# Patient Record
Sex: Female | Born: 1960 | Race: Black or African American | Hispanic: No | Marital: Married | Smoking: Former smoker
Health system: Southern US, Community
[De-identification: ages and names within clinical notes are randomized; demographics above are authoritative.]

## PROBLEM LIST (undated history)

## (undated) DIAGNOSIS — I10 Essential (primary) hypertension: Secondary | ICD-10-CM

## (undated) DIAGNOSIS — E119 Type 2 diabetes mellitus without complications: Secondary | ICD-10-CM

## (undated) DIAGNOSIS — E7800 Pure hypercholesterolemia, unspecified: Secondary | ICD-10-CM

## (undated) HISTORY — PX: CHOLECYSTECTOMY: SHX55

---

## 2008-01-07 ENCOUNTER — Emergency Department: Payer: Self-pay | Admitting: Emergency Medicine

## 2009-10-22 ENCOUNTER — Ambulatory Visit: Payer: Self-pay

## 2009-10-28 ENCOUNTER — Ambulatory Visit: Payer: Self-pay

## 2010-04-08 ENCOUNTER — Ambulatory Visit: Payer: Self-pay

## 2014-11-29 ENCOUNTER — Ambulatory Visit (INDEPENDENT_AMBULATORY_CARE_PROVIDER_SITE_OTHER): Payer: BC Managed Care – PPO

## 2014-11-29 ENCOUNTER — Encounter: Payer: Self-pay | Admitting: Podiatry

## 2014-11-29 ENCOUNTER — Ambulatory Visit (INDEPENDENT_AMBULATORY_CARE_PROVIDER_SITE_OTHER): Payer: BC Managed Care – PPO | Admitting: Podiatry

## 2014-11-29 VITALS — BP 125/90 | HR 72 | Resp 16 | Ht 62.0 in | Wt 192.0 lb

## 2014-11-29 DIAGNOSIS — M2040 Other hammer toe(s) (acquired), unspecified foot: Secondary | ICD-10-CM

## 2014-11-29 DIAGNOSIS — L84 Corns and callosities: Secondary | ICD-10-CM

## 2014-11-29 DIAGNOSIS — M779 Enthesopathy, unspecified: Secondary | ICD-10-CM

## 2014-11-29 MED ORDER — TRIAMCINOLONE ACETONIDE 10 MG/ML IJ SUSP
10.0000 mg | Freq: Once | INTRAMUSCULAR | Status: AC
  Administered 2014-11-29: 10 mg

## 2014-11-29 NOTE — Progress Notes (Signed)
   Subjective:    Patient ID: Crystal Aguilar, female    DOB: 11/15/1961, 53 y.o.   MRN: [id number]  HPI Comments: i have corns and calluses , and i have a piece of glass on the bottom of my foot since i was 53 years old, try to dig it out and i cant get it. The  Bottom of my feet are painful   Foot Pain      Review of Systems  All other systems reviewed and are negative.      Objective:   Physical Exam        Assessment & Plan:

## 2014-11-30 NOTE — Progress Notes (Signed)
Subjective:     Patient ID: Crystal Aguilar, female   DOB: 04/09/1961, 53 y.o.   MRN: [id number]  HPI patient states I have severe discomfort in my little toes of both feet underneath the fifth metatarsal right over left and also in the right arch were I stepped on something when I was a child. 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      Objective:   Physical Exam  Constitutional: She is oriented to person, place, and time.  Cardiovascular: Intact distal pulses.   Musculoskeletal: Normal range of motion.  Neurological: She is oriented to person, place, and time.  Skin: Skin is warm and dry.  Nursing note and vitals reviewed.  neurovascular status intact with muscle strength adequate and range of motion subtalar midtarsal joint within normal limits. Patient is noted to have severe keratotic lesions fifth digits of both feet severe keratotic lesion sub-fifth metatarsal right over left with fluid buildup around the fifth MPJ right and keratotic lesion within the left plantar arch that's painful when palpated     Assessment:     Significant skin structural issues with hammertoe deformity plantarflexed metatarsal capsulitis right and significant lesion formation    Plan:     H&P x-rays reviewed and conditions explained to patient. Today I injected the right fifth MPJ capsule 3 mg Kenalog 5 g Xylocaine and did deep debridement of all lesions. Patient tolerated procedures well will be seen back in 8 weeks and understands that ultimately she may require surgery which even with this may not solve the severe skin manifestation she has

## 2015-01-31 ENCOUNTER — Ambulatory Visit: Payer: BC Managed Care – PPO | Admitting: Podiatry

## 2015-02-14 ENCOUNTER — Ambulatory Visit: Payer: Self-pay | Admitting: Podiatry

## 2016-12-07 ENCOUNTER — Ambulatory Visit (INDEPENDENT_AMBULATORY_CARE_PROVIDER_SITE_OTHER): Payer: Self-pay | Admitting: Podiatry

## 2016-12-07 ENCOUNTER — Ambulatory Visit (INDEPENDENT_AMBULATORY_CARE_PROVIDER_SITE_OTHER): Payer: Self-pay

## 2016-12-07 VITALS — BP 178/97 | HR 85 | Temp 97.2°F | Resp 16

## 2016-12-07 DIAGNOSIS — L84 Corns and callosities: Secondary | ICD-10-CM

## 2016-12-07 DIAGNOSIS — M79672 Pain in left foot: Secondary | ICD-10-CM

## 2016-12-07 DIAGNOSIS — R52 Pain, unspecified: Secondary | ICD-10-CM

## 2016-12-07 DIAGNOSIS — L851 Acquired keratosis [keratoderma] palmaris et plantaris: Secondary | ICD-10-CM

## 2016-12-07 DIAGNOSIS — M79671 Pain in right foot: Secondary | ICD-10-CM

## 2016-12-18 NOTE — Progress Notes (Signed)
Subjective: Patient presents to the office today for chief complaint of painful callus lesions of the feet. Patient states that the pain is ongoing and is affecting their ability to ambulate without pain. Patient presents today for further treatment and evaluation. Patient also states that when she was 55 years old she stepped on a piece of glass to her left plantar foot. Patient believes the piece of glass is still there. Patient has experienced the left foot callus on the plantar heel for greater than 1 year.  Objective:  Physical Exam General: Alert and oriented x3 in no acute distress  Dermatology: Hyperkeratotic lesion present on the weightbearing surface of the bilateral feet and a plantar aspect of the left heel. Pain on palpation with a central nucleated core noted.  Skin is warm, dry and supple bilateral lower extremities. Negative for open lesions or macerations.  Vascular: Palpable pedal pulses bilaterally. No edema or erythema noted. Capillary refill within normal limits.  Neurological: Epicritic and protective threshold grossly intact bilaterally.   Musculoskeletal Exam: Pain on palpation at the keratotic lesion noted. Range of motion within normal limits bilateral. Muscle strength 5/5 in all groups bilateral.  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  #3 Treated area(s) with Salinocaine and dressed with light dressing. #4 local anesthesia block was performed to the left plantar heel using 3 mL of lidocaine with epinephrine. The plantar heel callus was debrided down to viable subcutaneous tissue however no foreign body or glass was identified.  #5 dry sterile dressing was applied. #6 Patient is to return to the clinic in 2 weeks   Felecia ShellingBrent M.  Evans, DPM [facility]

## 2017-03-08 ENCOUNTER — Ambulatory Visit: Payer: Self-pay | Admitting: Podiatry

## 2017-10-17 ENCOUNTER — Emergency Department
Admission: EM | Admit: 2017-10-17 | Discharge: 2017-10-17 | Disposition: A | Discharge status: Home / Self Care | Payer: BLUE CROSS/BLUE SHIELD | Attending: Emergency Medicine | Admitting: Emergency Medicine

## 2017-10-17 ENCOUNTER — Encounter: Payer: Self-pay | Admitting: Emergency Medicine

## 2017-10-17 DIAGNOSIS — K0889 Other specified disorders of teeth and supporting structures: Secondary | ICD-10-CM

## 2017-10-17 DIAGNOSIS — G5601 Carpal tunnel syndrome, right upper limb: Secondary | ICD-10-CM | POA: Diagnosis not present

## 2017-10-17 DIAGNOSIS — F17210 Nicotine dependence, cigarettes, uncomplicated: Secondary | ICD-10-CM | POA: Diagnosis not present

## 2017-10-17 MED ORDER — NAPROXEN 375 MG PO TABS: 375.0000 mg | ORAL_TABLET | Freq: Two times a day (BID) | ORAL | 0 refills | Status: AC

## 2017-10-17 MED ORDER — CLINDAMYCIN HCL 300 MG PO CAPS: 300.0000 mg | ORAL_CAPSULE | Freq: Three times a day (TID) | ORAL | 0 refills | Status: AC

## 2017-10-17 NOTE — ED Provider Notes (Signed)
[facility] Emergency Department Provider Note  ____________________________________________   I have reviewed the triage vital signs and the nursing notes.   HISTORY  Chief Complaint Dental Pain and Headache    HPI Crystal Aguilar is a 56 y.o. female 2 different complaints the first is she has chronic dental pain for year and a half.  She does not want to go see a dentist because she does not like dentist no fever no change in that, no headache, just dental pain.  The second is that for the last several months she has been having pain in her right hand which is a cramping discomfort when she sleeps sometimes and also when she moves it the wrong way.  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  She has cramps in that hand specifically no chest pain or shortness of breath no referred pain down her arm.  She denies any weakness.  She states that she was having some cramping in the hand earlier today which her primary care doctor and was sent to the emergency room.  Location hand, right Radiation none Quality chronic cramping occasional tingling Duration months Timing at rest when she is lying on it sometimes as well as at work sometimes Severity mild to moderate at this time not bad Associated sxs none PriorTreatment none   History reviewed. No pertinent past medical history.  There are no active problems to display for this patient.   History reviewed. No pertinent surgical history.  Prior to Admission medications   Not on File    Allergies Penicillins  No family history on file.  Social History Social History  Substance Use Topics  . Smoking status: Current Every Day Smoker    Types: Cigarettes  . Smokeless tobacco: Never Used  . Alcohol use 0.0 oz/week    Review of Systems Constitutional: No fever/chills Eyes: No visual changes. ENT: No sore throat. No stiff neck no neck pain Cardiovascular: Denies  chest pain. Respiratory: Denies shortness of breath. Gastrointestinal:   no vomiting.  No diarrhea.  No constipation. Genitourinary: Negative for dysuria. Musculoskeletal: Negative lower extremity swelling Skin: Negative for rash. Neurological: Negative for severe headaches, focal weakness or numbness.   ____________________________________________   PHYSICAL EXAM:  VITAL SIGNS: ED Triage Vitals  Enc Vitals Group     BP 10/17/17 1728 (!) 146/87     Pulse Rate 10/17/17 1728 79     Resp 10/17/17 1728 16     Temp 10/17/17 1728 97.8 F (36.6 C)     Temp Source 10/17/17 1728 Oral     SpO2 --      Weight 10/17/17 1729 212 lb (96.2 kg)     Height 10/17/17 1729 5\' 2"  (1.575 m)     Head Circumference --      Peak Flow --      Pain Score 10/17/17 1728 4     Pain Loc --      Pain Edu? --      Excl. in GC? --     Constitutional: Alert and oriented. Well appearing and in no acute distress. Eyes: Conjunctivae are normal Head: Atraumatic HEENT: No congestion/rhinnorhea. Mucous membranes are moist.  Oropharynx non-erythematous, very poor dentition, there is minimal pain to palpation in poor dentition on the upper tooth on the right which is a sole remaining molar, as well as the remaining molars on the right which are not significantly decayed no abscess noted no swelling  or inflammation Neck:   Nontender with no meningismus, no masses, no stridor Cardiovascular: Normal rate, regular rhythm. Grossly normal heart sounds.  Good peripheral circulation. Respiratory: Normal respiratory effort.  No retractions. Lungs CTAB. Abdominal: Soft and nontender. No distention. No guarding no rebound Back:  There is no focal tenderness or step off.  there is no midline tenderness there are no lesions noted. there is no CVA tenderness  Musculoskeletal: No lower extremity tenderness, no upper extremity tenderness. No joint effusions, no DVT signs strong distal pulses no edema Neurologic:  Normal speech  and language. No gross focal neurologic deficits are appreciated.  Positive Phalen's test, so palpation of the flexor retinaculum brings up the patient's symptoms.  No thenar atrophy noted.  No hypothenar atrophy noted.  Strength and sensation intact in all fields of the hand. Skin:  Skin is warm, dry and intact. No rash noted. Psychiatric: Mood and affect are normal. Speech and behavior are normal.  ____________________________________________   LABS (all labs ordered are listed, but only abnormal results are displayed)  Labs Reviewed - No data to display  Pertinent labs  results that were available during my care of the patient were reviewed by me and considered in my medical decision making (see chart for details). ____________________________________________  EKG  I personally interpreted any EKGs ordered by me or triage  ____________________________________________  RADIOLOGY  Pertinent labs & imaging results that were available during my care of the patient were reviewed by me and considered in my medical decision making (see chart for details). If possible, patient and/or family made aware of any abnormal findings. ____________________________________________    PROCEDURES  Procedure(s) performed: None  Procedures  Critical Care performed: None  ____________________________________________   INITIAL IMPRESSION / ASSESSMENT AND PLAN / ED COURSE  Pertinent labs & imaging results that were available during my care of the patient were reviewed by me and considered in my medical decision making (see chart for details).  Patient here with chief complaints of first is a chronic dental pain, patient does not have any evidence of an abscess she has been having pain every single day for the last year and a half" is stubborn" does not want to go see a dentist.  I do not see any evidence of abscess but as the pain is slightly worse over the last couple days, we will give her  penicillin and close outpatient follow-up.  Return precautions are given and understood.  Nothing to suggest Ludwig's angina or other acute pathology of the mouth.  Patient with cramping and tingling in the right hand isolated to the right hand for 6 months at least possibly as long as a year or more.  Exam is very reassuring she has an NIH stroke scale of 0, no evidence of weakness, not likely to be radiculopathy, involves the entire hand is very positional, patient does repetitive motions, is elicitable with palpation of the flexor retinaculum, most likely this is carpal tunnel we will place her in a splint, advised NSAIDs and have her closely follow up with primary care.    ____________________________________________   FINAL CLINICAL IMPRESSION(S) / ED DIAGNOSES  Final diagnoses:  None      This chart was dictated using voice recognition software.  Despite best efforts to proofread,  errors can occur which can change meaning.      Jeanmarie Plant, MD 10/17/17 [phone]

## 2017-10-17 NOTE — Discharge Instructions (Signed)
No increased pain or swelling in your mouth, numbness or weakness anywhere besides your hand,  worsening numbness or weakness in the hand, increased headache, increased dental pain, swelling or pain you feel worse in any way return to the emergency room.    OPTIONS FOR DENTAL FOLLOW UP CARE  [place] [facility] Health and [facility] Services - Local Safety Net Dental Clinics [email or web address]   [facility] [phone])  [facility] [phone])  [place] [phone])  [facility] [phone])  [facility] [phone]) This clinic caters to the indigent population and is on a lottery system. Location: [employer] of Dentistry, [employer], [address] [place] Clinic Hours: Wednesdays from 6pm - 9pm, patients seen by a lottery system. For dates, call or go to [email or web address] Services: Cleanings, fillings and simple extractions. Payment Options: DENTAL WORK IS FREE OF CHARGE. Bring proof of income or support. Best way to get seen: Arrive at 5:15 pm - this is a lottery, NOT first come/first serve, so arriving earlier will not increase your chances of being seen.     [facility] [phone] Select option 1 for emergencies   Location: [facility] of Dentistry, [place], [address] [place] Clinic Hours: No walk-ins accepted - call the day before to schedule an appointment. Check in times are 9:30 am and 1:30 pm. Services: Simple extractions, temporary fillings, pulpectomy/pulp debridement, uncomplicated abscess drainage. Payment Options: PAYMENT IS DUE AT THE TIME OF SERVICE.  Fee is usually $100-200, additional surgical procedures (e.g. abscess drainage) may be extra. Cash, checks, Visa/MasterCard accepted.  Can file Medicaid if patient is covered for dental - patient should call  case worker to check. No discount for [facility] patients. Best way to get seen: MUST call the day before and get onto the schedule. Can usually be seen the next 1-2 days. No walk-ins accepted.     [facility] Dental Services [phone]   Location: [facility], [address], [place] Clinic Hours: M, W, Th, F 8am or 1:30pm, Tues 9a or 1:30 - first come/first served. Services: Simple extractions, temporary fillings, uncomplicated abscess drainage.  You do not need to be an [facility] resident. Payment Options: PAYMENT IS DUE AT THE TIME OF SERVICE. Dental insurance, otherwise sliding scale - bring proof of income or support. Depending on income and treatment needed, cost is usually $50-200. Best way to get seen: Arrive early as it is first come/first served.     [facility] [phone]   Location: [address] Clinic Hours: Mon-Thu 8a-5p Services: Most basic dental services including extractions and fillings. Payment Options: PAYMENT IS DUE AT THE TIME OF SERVICE. Sliding scale, up to 50% off - bring proof if income or support. Medicaid with dental option accepted. Best way to get seen: Call to schedule an appointment, can usually be seen within 2 weeks OR they will try to see walk-ins - show up at 8a or 2p (you may have to wait).     [facility] [phone] [place] RESIDENTS ONLY   Location: [facility], [address] [place], [place] [address] Clinic Hours: By appointment only. Monday - Thursday 8am-5pm, Friday 8am-12pm Services: Cleanings, fillings, extractions. Payment Options: PAYMENT IS DUE AT THE TIME OF SERVICE. Cash, Visa or MasterCard. Sliding scale - $30 minimum per service. Best way to get seen: Come in to office, complete packet and  make an appointment - need proof of income or support monies for each household member and proof of  [place] residence. Usually takes about a month to get in.     [facility] [phone]   Location: [address] [place] Clinic Hours: Walk-in Urgent Care Dental Services are offered Monday-Friday mornings only. The numbers of emergencies accepted daily is limited to the number of providers available. Maximum 15 - Mondays, Wednesdays & Thursdays Maximum 10 - Tuesdays & Fridays Services: You do not need to be a [place] resident to be seen for a dental emergency. Emergencies are defined as pain, swelling, abnormal bleeding, or dental trauma. Walkins will receive x-rays if needed. NOTE: Dental cleaning is not an emergency. Payment Options: PAYMENT IS DUE AT THE TIME OF SERVICE. Minimum co-pay is $40.00 for uninsured patients. Minimum co-pay is $3.00 for Medicaid with dental coverage. Dental Insurance is accepted and must be presented at time of visit. Medicare does not cover dental. Forms of payment: Cash, credit card, checks. Best way to get seen: If not previously registered with the clinic, walk-in dental registration begins at 7:15 am and is on a first come/first serve basis. If previously registered with the clinic, call to make an appointment.     [facility] [phone] [place] RESIDENTS ONLY   Location: [address], [place], [place] Clinic Hours: Mon-Thu 10a-2p Services: Extractions only! Payment Options: FREE (donations accepted) - bring proof of income or support Best way to get seen: Call and schedule an appointment OR come at 8am on the 1st Monday of every month (except for holidays) when it is first come/first served.     [facility] [phone]   Location: [address], [place] Clinic Hours: Friday mornings Services, Payment Options, Best way to get seen: Call for info

## 2017-10-17 NOTE — ED Notes (Signed)
Upon assessment pt alert and oriented x 4 and in no apparent distress. Pt does report tingling and burning in fingers and toes. Pt has equal palpable pulses in all 4 extremities.

## 2017-10-17 NOTE — ED Notes (Signed)

## 2017-10-17 NOTE — ED Triage Notes (Signed)
C/O right hand burning earlier and now right hand tingling also c/o right sided headache and jaw pain.  Jaw pain intermittently "for a while, but that is dental pain"  Hand tingling started today at around 1200 and headache started around the same time.

## 2019-08-28 ENCOUNTER — Other Ambulatory Visit: Payer: Self-pay

## 2019-08-28 ENCOUNTER — Emergency Department
Admission: EM | Admit: 2019-08-28 | Discharge: 2019-08-29 | Disposition: A | Discharge status: Home / Self Care | Payer: BC Managed Care – PPO | Attending: Emergency Medicine | Admitting: Emergency Medicine

## 2019-08-28 ENCOUNTER — Emergency Department: Payer: BC Managed Care – PPO

## 2019-08-28 DIAGNOSIS — Y939 Activity, unspecified: Secondary | ICD-10-CM | POA: Insufficient documentation

## 2019-08-28 DIAGNOSIS — F17210 Nicotine dependence, cigarettes, uncomplicated: Secondary | ICD-10-CM | POA: Diagnosis not present

## 2019-08-28 DIAGNOSIS — W19XXXA Unspecified fall, initial encounter: Secondary | ICD-10-CM | POA: Diagnosis not present

## 2019-08-28 DIAGNOSIS — S43014A Anterior dislocation of right humerus, initial encounter: Secondary | ICD-10-CM | POA: Insufficient documentation

## 2019-08-28 DIAGNOSIS — Y92009 Unspecified place in unspecified non-institutional (private) residence as the place of occurrence of the external cause: Secondary | ICD-10-CM | POA: Diagnosis not present

## 2019-08-28 DIAGNOSIS — Y999 Unspecified external cause status: Secondary | ICD-10-CM | POA: Diagnosis not present

## 2019-08-28 DIAGNOSIS — S4991XA Unspecified injury of right shoulder and upper arm, initial encounter: Secondary | ICD-10-CM | POA: Diagnosis present

## 2019-08-28 MED ORDER — MORPHINE SULFATE (PF) 4 MG/ML IV SOLN
4.0000 mg | Freq: Once | INTRAVENOUS | Status: AC
  Administered 2019-08-28: 4 mg via INTRAVENOUS
  Filled 2019-08-28: qty 1

## 2019-08-28 MED ORDER — HYDROMORPHONE HCL 1 MG/ML IJ SOLN
1.0000 mg | Freq: Once | INTRAMUSCULAR | Status: AC
  Administered 2019-08-28: 1 mg via INTRAVENOUS

## 2019-08-28 MED ORDER — HYDROMORPHONE HCL 1 MG/ML IJ SOLN
INTRAMUSCULAR | Status: AC
  Filled 2019-08-28: qty 1

## 2019-08-28 NOTE — ED Provider Notes (Signed)
[facility] Emergency Department Provider Note  ____________________________________________   First MD Initiated Contact with Patient 08/28/19 2258     (approximate)  I have reviewed the triage vital signs and the nursing notes.   HISTORY  Chief Complaint Fall   HPI Crystal Aguilar is a 58 y.o. female presents to the emergency department via EMS following accidental trip and fall at home.  Patient admits to 10 out of 10 right shoulder pain.  EMS noted gross deformity.  Patient given 50 mcg of fentanyl in route however pain score still 10 out of 10.      History reviewed. No pertinent past medical history.  There are no active problems to display for this patient.   History reviewed. No pertinent surgical history.  Prior to Admission medications   Medication Sig Start Date End Date Taking? Authorizing Provider  oxyCODONE-acetaminophen (PERCOCET) 5-325 MG tablet Take 1 tablet by mouth every 4 (four) hours as needed. 08/29/19 08/28/20  Darci CurrentBrown, Dawson N, MD    Allergies Penicillins  History reviewed. No pertinent family history.  Social History Social History   Tobacco Use  . Smoking status: Current Every Day Smoker    Types: Cigarettes  . Smokeless tobacco: Never Used  Substance Use Topics  . Alcohol use: Yes    Alcohol/week: 0.0 standard drinks  . Drug use: No    Review of Systems Constitutional: No fever/chills Eyes: No visual changes. ENT: No sore throat. Cardiovascular: Denies chest pain. Respiratory: Denies shortness of breath. Gastrointestinal: No abdominal pain.  No nausea, no vomiting.  No diarrhea.  No constipation. Genitourinary: Negative for dysuria. Musculoskeletal: Negative for neck pain.  Negative for back pain. Integumentary: Negative for rash. Neurological: Negative for headaches, focal weakness or numbness.   ____________________________________________   PHYSICAL EXAM:  VITAL SIGNS: ED Triage Vitals  Enc  Vitals Group     BP 08/28/19 2256 (!) 158/90     Pulse Rate 08/28/19 2256 64     Resp 08/28/19 2256 18     Temp 08/28/19 2256 98 F (36.7 C)     Temp Source 08/28/19 2256 Oral     SpO2 08/28/19 2256 97 %     Weight 08/28/19 2254 102.1 kg (225 lb)     Height 08/28/19 2254 1.575 m (5\' 2" )     Head Circumference --      Peak Flow --      Pain Score 08/28/19 2253 10     Pain Loc --      Pain Edu? --      Excl. in GC? --     Constitutional: Alert and oriented. Apparent discomfort Eyes: Conjunctivae are normal.  Head: Atraumatic. Mouth/Throat: Mucous membranes are moist. Neck: No stridor.  No meningeal signs.   Cardiovascular: Normal rate, regular rhythm. Good peripheral circulation. Grossly normal heart sounds. Respiratory: Normal respiratory effort.  No retractions. Gastrointestinal: Soft and nontender. No distention.  Musculoskeletal: Gross deformity of the right shoulder pain with minimal active  range of motion. Neurologic:  Normal speech and language. No gross focal neurologic deficits are appreciated.  Skin:  Skin is warm, dry and intact. Psychiatric: Mood and affect are normal. Speech and behavior are normal.  ____________________________________________   LABS (all labs ordered are listed, but only abnormal results are displayed)  Labs Reviewed - No data to display _____________________________________  RADIOLOGY I, [place] Dewayne ShorterN Carolyne Whitsel, personally viewed and evaluated these images (plain radiographs) as part of my medical decision making, as well as reviewing  the written report by the radiologist.  ED MD interpretation: Initial x-ray revealed anterior right shoulder dislocation.  Repeat x-ray revealed successful reduction of right shoulder dislocation.  Official radiology report(s): Dg Shoulder Right  Result Date: 08/29/2019 CLINICAL DATA:  Postreduction EXAM: RIGHT SHOULDER - 2+ VIEW COMPARISON:  08/28/2019 FINDINGS: Interval reduction of the previously seen dislocated  right shoulder. No fracture. AC joint is intact. IMPRESSION: Interval reduction.  No visible fracture. Electronically Signed   By: Charlett Nose M.D.   On: 08/29/2019 00:25   Dg Shoulder Right  Result Date: 08/28/2019 CLINICAL DATA:  58 year old female the with fall and trauma to the right shoulder. EXAM: RIGHT SHOULDER - 2+ VIEW COMPARISON:  None. FINDINGS: There is anterior dislocation of the right shoulder. No definite acute fracture. The soft tissues are unremarkable. IMPRESSION: Anterior dislocation of the right shoulder. Electronically Signed   By: Elgie Collard M.D.   On: 08/28/2019 23:21    ____________________________________________   PROCEDURES   .Ortho Injury Treatment  Date/Time: 08/29/2019 2:18 AM Performed by: Darci Current, MD Authorized by: Darci Current, MD   Consent:    Consent obtained:  Verbal   Consent given by:  Patient   Risks discussed:  Fracture, irreducible dislocation and nerve damage   Alternatives discussed:  Alternative treatmentInjury location: shoulder Location details: right shoulder Injury type: dislocation Dislocation type: anterior Hill-Sachs deformity: no Pre-procedure neurovascular assessment: neurovascularly intact Pre-procedure distal perfusion: normal Pre-procedure neurological function: normal Pre-procedure range of motion: normal  Anesthesia: Local anesthesia used: no  Patient sedated: NoManipulation performed: yes Reduction method: scapular manipulation Immobilization: sling Post-procedure neurovascular assessment: post-procedure neurovascularly intact Post-procedure distal perfusion: normal Post-procedure neurological function: normal Post-procedure range of motion: improved Patient tolerance: patient tolerated the procedure well with no immediate complications      ____________________________________________   INITIAL IMPRESSION / MDM / ASSESSMENT AND PLAN / ED COURSE  As part of my medical decision making, I  reviewed the following data within the electronic MEDICAL RECORD NUMBER   58 year old female presented with above-stated history and physical exam secondary to traumatic right shoulder anterior dislocation which was reduced in the emergency department.  Patient's shoulder actually repeatedly dislocated while in the ED however was successfully reduced each time.  Patient is currently in a sling.  Patient received IV morphine and subsequently Dilaudid in the emergency department for pain control.  Patient discussed with Dr. Ernest Pine who she will follow-up with on the outpatient setting.      ____________________________________________  FINAL CLINICAL IMPRESSION(S) / ED DIAGNOSES  Final diagnoses:  Anterior dislocation of right shoulder, initial encounter     MEDICATIONS GIVEN DURING THIS VISIT:  Medications  HYDROmorphone (DILAUDID) 1 MG/ML injection (has no administration in time range)  morphine 4 MG/ML injection 4 mg (4 mg Intravenous Given 08/28/19 2350)  HYDROmorphone (DILAUDID) injection 1 mg (1 mg Intravenous Given 08/28/19 2330)  oxyCODONE-acetaminophen (PERCOCET/ROXICET) 5-325 MG per tablet 1 tablet (1 tablet Oral Given 08/29/19 0134)     ED Discharge Orders         Ordered    oxyCODONE-acetaminophen (PERCOCET) 5-325 MG tablet  Every 4 hours PRN     08/29/19 0205          *Please note:  Crystal Aguilar was evaluated in Emergency Department on 08/29/2019 for the symptoms described in the history of present illness. She was evaluated in the context of the global COVID-19 pandemic, which necessitated consideration that the patient might be at risk for infection with the  SARS-CoV-2 virus that causes COVID-19. Institutional protocols and algorithms that pertain to the evaluation of patients at risk for COVID-19 are in a state of rapid change based on information released by regulatory bodies including the CDC and federal and state organizations. These policies and algorithms were followed  during the patient's care in the ED.  Some ED evaluations and interventions may be delayed as a result of limited staffing during the pandemic.*  Note:  This document was prepared using Dragon voice recognition software and may include unintentional dictation errors.   Gregor Hams, MD 08/29/19 Rogene Houston

## 2019-08-28 NOTE — ED Triage Notes (Signed)
Pt arrives from home via [facility] after falling on right knee then rolling onto right shoulder this evening. Pt given 38mcg fentanyl in route. PT in NAD, A&Ox4. Dr. Owens Shark at bedside

## 2019-08-28 NOTE — ED Notes (Signed)
Shoulder joint is back in place. Pt continues to lay on her stomach with left arm dangling. Weights removed. Husband at bedside.

## 2019-08-29 ENCOUNTER — Emergency Department: Payer: BC Managed Care – PPO

## 2019-08-29 MED ORDER — OXYCODONE-ACETAMINOPHEN 5-325 MG PO TABS: 1.0000 | ORAL_TABLET | ORAL | 0 refills | Status: DC | PRN

## 2019-08-29 MED ORDER — OXYCODONE-ACETAMINOPHEN 5-325 MG PO TABS
1.0000 | ORAL_TABLET | Freq: Once | ORAL | Status: AC
  Administered 2019-08-29: 1 via ORAL
  Filled 2019-08-29: qty 1

## 2019-08-29 NOTE — ED Notes (Signed)
This Rn in with DR Owens Shark to assist with manipulation of the right shoulder. Several attempts unsuccessful. Pt placed on abdomen with 10 pound weights applied and right arm to hang. Husband at bedside. Ortho paged.

## 2019-08-29 NOTE — ED Notes (Addendum)
HOB adjusted for pt. Pt states pain dec some once HOB adjusted.

## 2019-08-29 NOTE — ED Notes (Signed)
Pt resting in bed with right arm in sling. Pt is comfortable at this time,

## 2019-08-29 NOTE — ED Notes (Signed)
X-ray at bedside

## 2019-09-05 ENCOUNTER — Other Ambulatory Visit (HOSPITAL_COMMUNITY): Payer: Self-pay | Admitting: Student

## 2019-09-05 ENCOUNTER — Other Ambulatory Visit: Payer: Self-pay | Admitting: Student

## 2019-09-05 DIAGNOSIS — S46011A Strain of muscle(s) and tendon(s) of the rotator cuff of right shoulder, initial encounter: Secondary | ICD-10-CM

## 2019-09-05 DIAGNOSIS — S43014A Anterior dislocation of right humerus, initial encounter: Secondary | ICD-10-CM

## 2019-09-16 ENCOUNTER — Other Ambulatory Visit: Payer: Self-pay

## 2019-09-16 ENCOUNTER — Ambulatory Visit
Admission: RE | Admit: 2019-09-16 | Discharge: 2019-09-16 | Disposition: A | Discharge status: Home / Self Care | Payer: BC Managed Care – PPO | Source: Ambulatory Visit | Attending: Student | Admitting: Student

## 2019-09-16 DIAGNOSIS — S46011A Strain of muscle(s) and tendon(s) of the rotator cuff of right shoulder, initial encounter: Secondary | ICD-10-CM | POA: Insufficient documentation

## 2019-09-16 DIAGNOSIS — S43014A Anterior dislocation of right humerus, initial encounter: Secondary | ICD-10-CM | POA: Insufficient documentation

## 2020-04-13 ENCOUNTER — Other Ambulatory Visit: Payer: Self-pay

## 2020-04-13 ENCOUNTER — Emergency Department
Admission: EM | Admit: 2020-04-13 | Discharge: 2020-04-13 | Disposition: A | Discharge status: Home / Self Care | Payer: BC Managed Care – PPO | Attending: Emergency Medicine | Admitting: Emergency Medicine

## 2020-04-13 ENCOUNTER — Encounter: Payer: Self-pay | Admitting: Emergency Medicine

## 2020-04-13 ENCOUNTER — Emergency Department: Payer: BC Managed Care – PPO

## 2020-04-13 DIAGNOSIS — K5730 Diverticulosis of large intestine without perforation or abscess without bleeding: Secondary | ICD-10-CM | POA: Diagnosis not present

## 2020-04-13 DIAGNOSIS — R1030 Lower abdominal pain, unspecified: Secondary | ICD-10-CM | POA: Diagnosis present

## 2020-04-13 DIAGNOSIS — K5790 Diverticulosis of intestine, part unspecified, without perforation or abscess without bleeding: Secondary | ICD-10-CM

## 2020-04-13 DIAGNOSIS — F17210 Nicotine dependence, cigarettes, uncomplicated: Secondary | ICD-10-CM | POA: Diagnosis not present

## 2020-04-13 LAB — COMPREHENSIVE METABOLIC PANEL
ALT: 17 U/L (ref 0–44)
AST: 17 U/L (ref 15–41)
Albumin: 4.1 g/dL (ref 3.5–5.0)
Alkaline Phosphatase: 54 U/L (ref 38–126)
Anion gap: 8 (ref 5–15)
BUN: 21 mg/dL — ABNORMAL HIGH (ref 6–20)
CO2: 25 mmol/L (ref 22–32)
Calcium: 9.8 mg/dL (ref 8.9–10.3)
Chloride: 103 mmol/L (ref 98–111)
Creatinine, Ser: 0.82 mg/dL (ref 0.44–1.00)
GFR calc Af Amer: >60 mL/min (ref >60)
GFR calc non Af Amer: >60 mL/min (ref >60)
Glucose, Bld: 84 mg/dL (ref 70–99)
Potassium: 4.2 mmol/L (ref 3.5–5.1)
Sodium: 136 mmol/L (ref 135–145)
Total Bilirubin: 0.6 mg/dL (ref 0.3–1.2)
Total Protein: 7.6 g/dL (ref 6.5–8.1)

## 2020-04-13 LAB — URINALYSIS, COMPLETE (UACMP) WITH MICROSCOPIC
Bacteria, UA: NONE SEEN
Bilirubin Urine: NEGATIVE
Glucose, UA: NEGATIVE mg/dL
Ketones, ur: NEGATIVE mg/dL
Leukocytes,Ua: NEGATIVE
Nitrite: NEGATIVE
Protein, ur: NEGATIVE mg/dL
Specific Gravity, Urine: 1.02 (ref 1.005–1.030)
pH: 6 (ref 5.0–8.0)

## 2020-04-13 LAB — CBC
HCT: 44.6 % (ref 36.0–46.0)
Hemoglobin: 14.4 g/dL (ref 12.0–15.0)
MCH: 27.5 pg (ref 26.0–34.0)
MCHC: 32.3 g/dL (ref 30.0–36.0)
MCV: 85.1 fL (ref 80.0–100.0)
Platelets: 280 10*3/uL (ref 150–400)
RBC: 5.24 MIL/uL — ABNORMAL HIGH (ref 3.87–5.11)
RDW: 13.9 % (ref 11.5–15.5)
WBC: 7 10*3/uL (ref 4.0–10.5)
nRBC: 0 % (ref 0.0–0.2)

## 2020-04-13 LAB — LIPASE, BLOOD: Lipase: 45 U/L (ref 11–51)

## 2020-04-13 NOTE — ED Provider Notes (Signed)
[facility] Emergency Department Provider Note  ____________________________________________   First MD Initiated Contact with Patient 04/13/20 1640     (approximate)  I have reviewed the triage vital signs and the nursing notes.   HISTORY  Chief Complaint Abdominal Pain    HPI Crystal Aguilar is a 59 y.o. female presents emergency department with complaints of lower abdominal pain that goes from the left to the right side.  Patient still has an appendix.  Patient states it feels like it is above her bladder and had some difficulty urinating.  She denies any fever or chills.  No chest pain or shortness of breath.  No vomiting or diarrhea    History reviewed. No pertinent past medical history.  There are no problems to display for this patient.   History reviewed. No pertinent surgical history.  Prior to Admission medications   Not on File    Allergies Penicillins  History reviewed. No pertinent family history.  Social History Social History   Tobacco Use  . Smoking status: Current Every Day Smoker    Types: Cigarettes  . Smokeless tobacco: Never Used  Substance Use Topics  . Alcohol use: Yes    Alcohol/week: 0.0 standard drinks  . Drug use: No    Review of Systems  Constitutional: No fever/chills Eyes: No visual changes. ENT: No sore throat. Respiratory: Denies cough Cardiovascular: Denies chest pain Gastrointestinal: Positive abdominal pain Genitourinary: Negative for dysuria. Musculoskeletal: Negative for back pain. Skin: Negative for rash. Psychiatric: no mood changes,     ____________________________________________   PHYSICAL EXAM:  VITAL SIGNS: ED Triage Vitals  Enc Vitals Group     BP 04/13/20 1344 (!) 146/87     Pulse Rate 04/13/20 1344 74     Resp 04/13/20 1344 18     Temp 04/13/20 1344 98.3 F (36.8 C)     Temp Source 04/13/20 1344 Oral     SpO2 04/13/20 1344 100 %     Weight 04/13/20 1345 215 lb  (97.5 kg)     Height 04/13/20 1345 5\' 2"  (1.575 m)     Head Circumference --      Peak Flow --      Pain Score 04/13/20 1345 10     Pain Loc --      Pain Edu? --      Excl. in GC? --     Constitutional: Alert and oriented. Well appearing and in no acute distress. Eyes: Conjunctivae are normal.  Head: Atraumatic. Nose: No congestion/rhinnorhea. Mouth/Throat: Mucous membranes are moist.   Neck:  supple no lymphadenopathy noted Cardiovascular: Normal rate, regular rhythm. Heart sounds are normal Respiratory: Normal respiratory effort.  No retractions, lungs c t a  Abd: soft tender in the lower abdomen, increased across suprapubic area towards the right, bs normal all 4 quad GU: deferred Musculoskeletal: FROM all extremities, warm and well perfused Neurologic:  Normal speech and language.  Skin:  Skin is warm, dry and intact. No rash noted. Psychiatric: Mood and affect are normal. Speech and behavior are normal.  ____________________________________________   LABS (all labs ordered are listed, but only abnormal results are displayed)  Labs Reviewed  COMPREHENSIVE METABOLIC PANEL - Abnormal; Notable for the following components:      Result Value   BUN 21 (*)    All other components within normal limits  CBC - Abnormal; Notable for the following components:   RBC 5.24 (*)    All other components within normal limits  URINALYSIS, COMPLETE (UACMP) WITH MICROSCOPIC - Abnormal; Notable for the following components:   Hgb urine dipstick MODERATE (*)    All other components within normal limits  LIPASE, BLOOD   ____________________________________________   ____________________________________________  RADIOLOGY  CT renal stone  ____________________________________________   PROCEDURES  Procedure(s) performed: No  Procedures    ____________________________________________   INITIAL IMPRESSION / ASSESSMENT AND PLAN / ED COURSE  Pertinent labs & imaging results  that were available during my care of the patient were reviewed by me and considered in my medical decision making (see chart for details).   Patient is a 59 year old female presents emergency department with complaints of abdominal pain.  See HPI  Physical exam shows patient to appear well.  Abdomen is tender in the left and right lower quadrant but more so suprapubically  DDx: Kidney stone, appendicitis, UTI  CBC is normal, comprehensive metabolic panel is normal, urinalysis has moderate amount of hemoglobin no bacteria  CT renal stone study shows diverticulosis but no other abnormality  I did explain the findings to the patient.  I feel that this is a chronic intermittent abdominal pain.  With her concerns of urination a explained her she may want to consider following up with a GYN doctor to see if her bladder has dropped.  She is to drink plenty of fluids.  Return emergency department worsening.  Is given a work note and discharged stable condition.    Crystal Aguilar was evaluated in Emergency Department on 04/13/2020 for the symptoms described in the history of present illness. She was evaluated in the context of the global COVID-19 pandemic, which necessitated consideration that the patient might be at risk for infection with the SARS-CoV-2 virus that causes COVID-19. Institutional protocols and algorithms that pertain to the evaluation of patients at risk for COVID-19 are in a state of rapid change based on information released by regulatory bodies including the CDC and federal and state organizations. These policies and algorithms were followed during the patient's care in the ED.   As part of my medical decision making, I reviewed the following data within the [place] notes reviewed and incorporated, Labs reviewed , Old chart reviewed, Radiograph reviewed , Notes from prior ED visits and [place] Controlled Substance  Database  ____________________________________________   FINAL CLINICAL IMPRESSION(S) / ED DIAGNOSES  Final diagnoses:  Diverticulosis  Lower abdominal pain      NEW MEDICATIONS STARTED DURING THIS VISIT:  Current Discharge Medication List       Note:  This document was prepared using Dragon voice recognition software and may include unintentional dictation errors.    Versie Starks, PA-C 04/13/20 1817    Earleen Newport, MD 04/13/20 Einar Crow

## 2020-04-13 NOTE — ED Triage Notes (Signed)
FIRST NURSE NOTE- here for lower abdominal pain from [facility].  NAD at this time.

## 2020-04-13 NOTE — Discharge Instructions (Signed)
Follow-up with your regular doctor if not improving in 3 to 4 days.  Return emergency department if worsening.  You may want to visit a GYN to see if your bladder has dropped as you continue to have difficulty with urination.  Return as needed.

## 2020-04-13 NOTE — ED Triage Notes (Signed)
Pt here for lower abdominal pain that goes across whole lower area. Denies NVD.  Describes pain as severe and constant.  No hx of same.  No fever.  VSS at this time.

## 2020-09-08 IMAGING — CT CT RENAL STONE PROTOCOL
2 of 4 series · 16 of 46 positions shown, 18 images · non-contrast
Comparison: None.

CLINICAL DATA: Lower abdominal pain

EXAM:
CT ABDOMEN AND PELVIS WITHOUT CONTRAST
TECHNIQUE: Multidetector CT imaging of the abdomen and pelvis was performed
following the standard protocol without IV contrast.

[Series 2: stone full standard · axial · 0.84mm/px · z∈[-480,-95]mm · 13 of 85 slices shown, 15 images]
[im 4/85  soft-tissue]
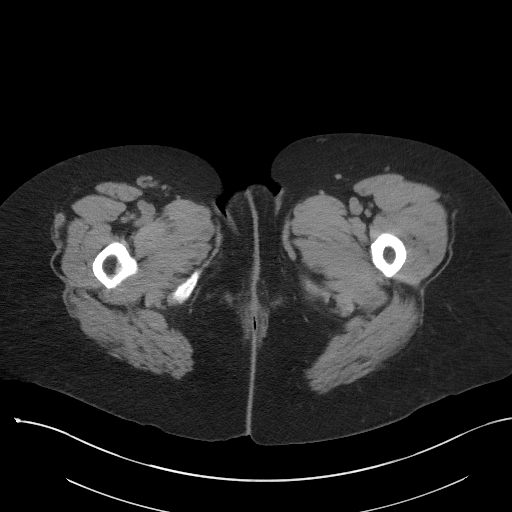
[im 4/85  bone]
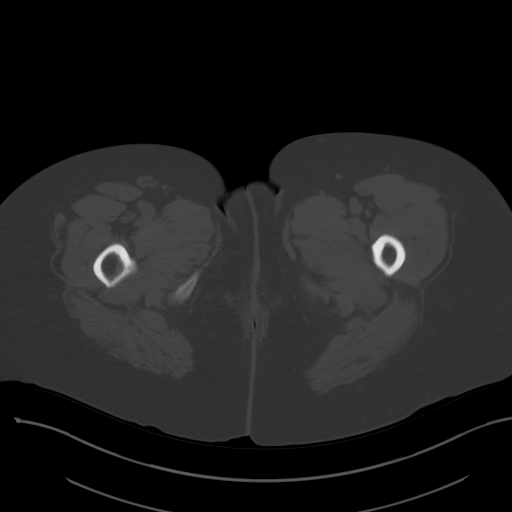
[im 11/85  soft-tissue]
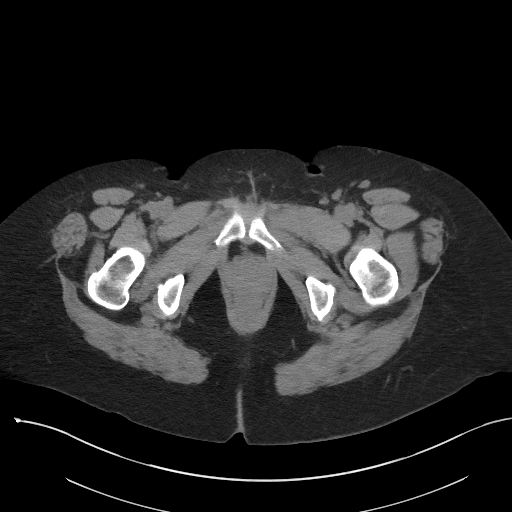
[im 17/85  soft-tissue]
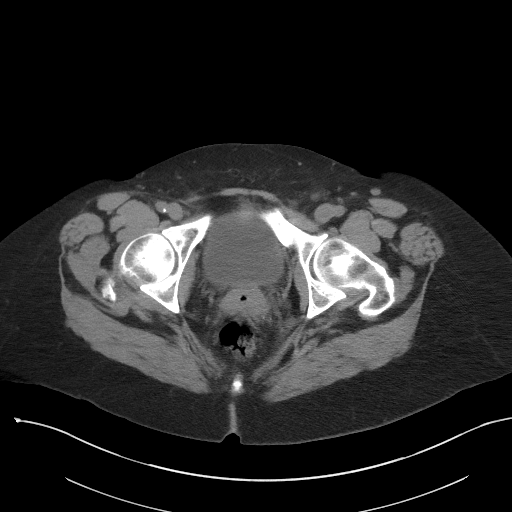
[im 24/85  soft-tissue]
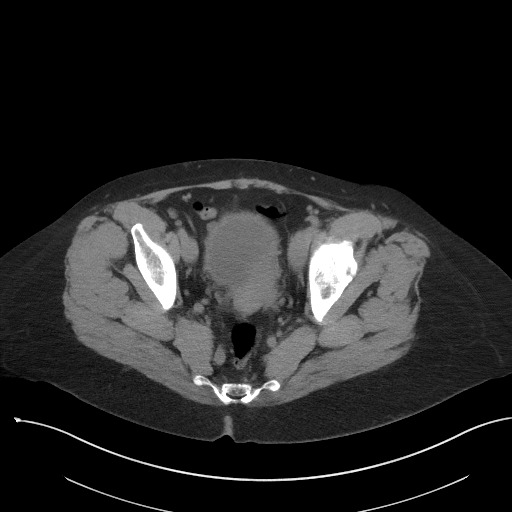
[im 31/85  soft-tissue]
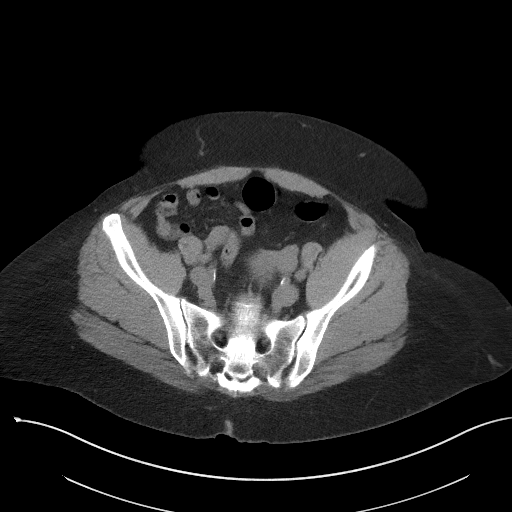
[im 37/85  soft-tissue]
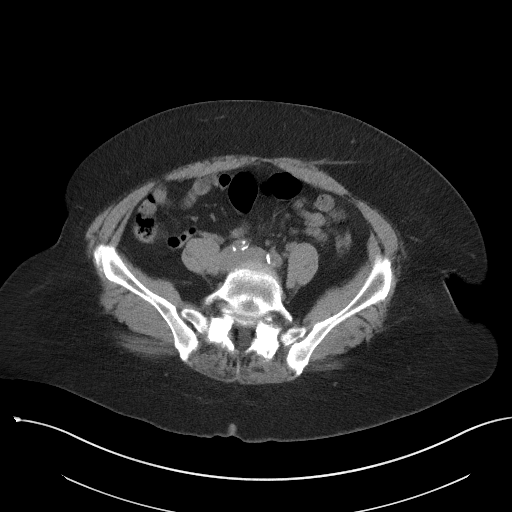
[im 44/85  soft-tissue]
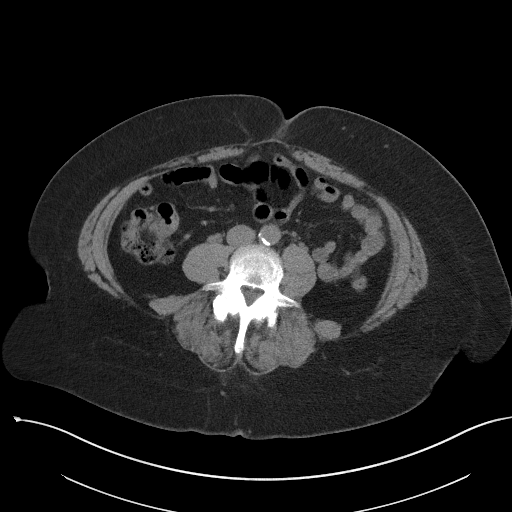
[im 48/85  soft-tissue]
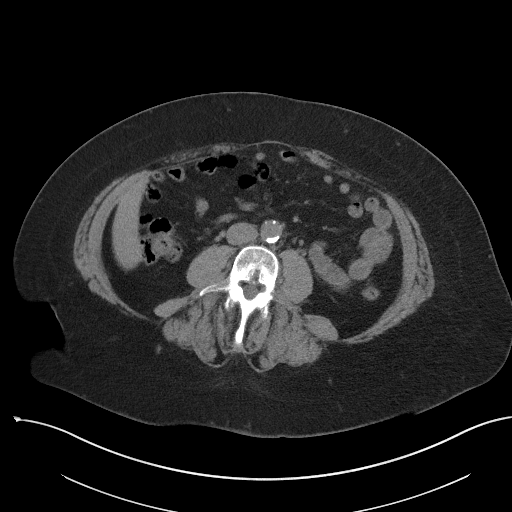
[im 54/85  soft-tissue]
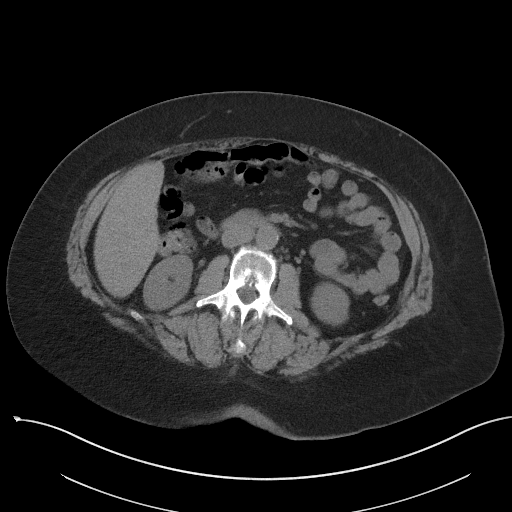
[im 54/85  bone]
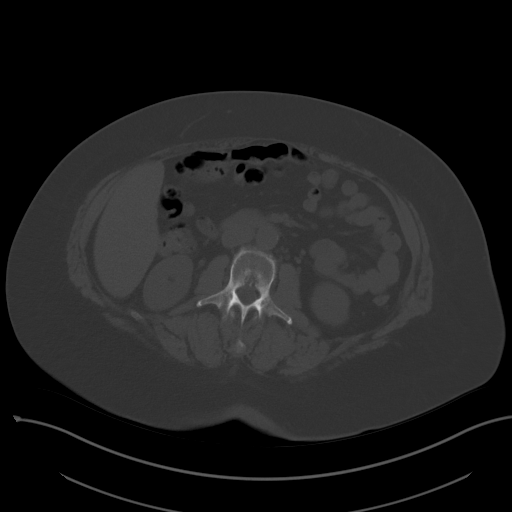
[im 61/85  soft-tissue]
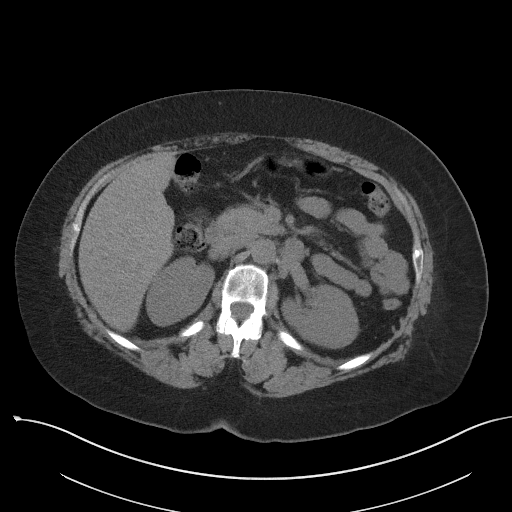
[im 68/85  soft-tissue]
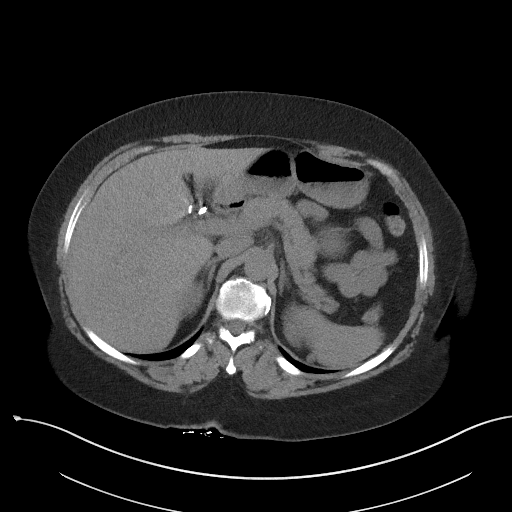
[im 74/85  soft-tissue]
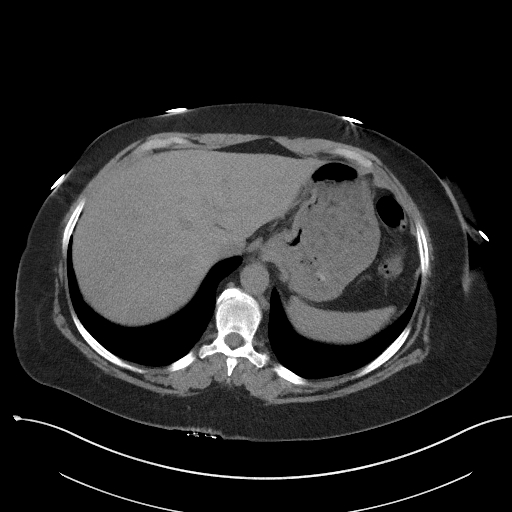
[im 81/85  soft-tissue]
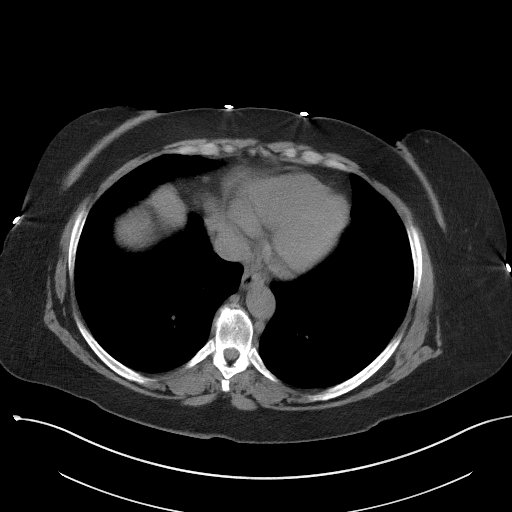

[Series 5: coronal · coronal · 0.72mm/px · 3 of 149 slices shown]
[im 50/149  soft-tissue]
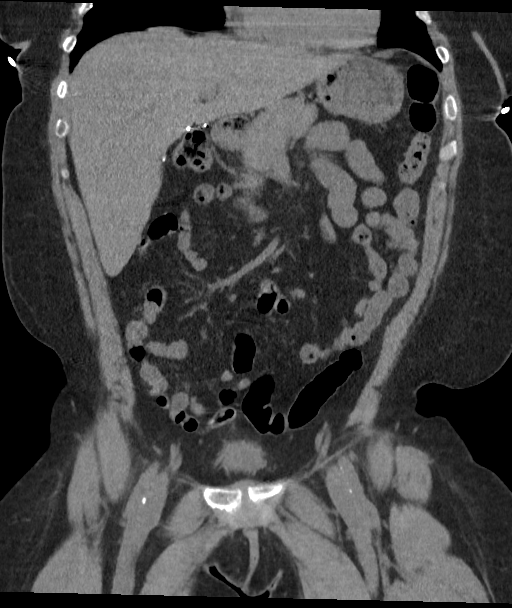
[im 66/149  soft-tissue]
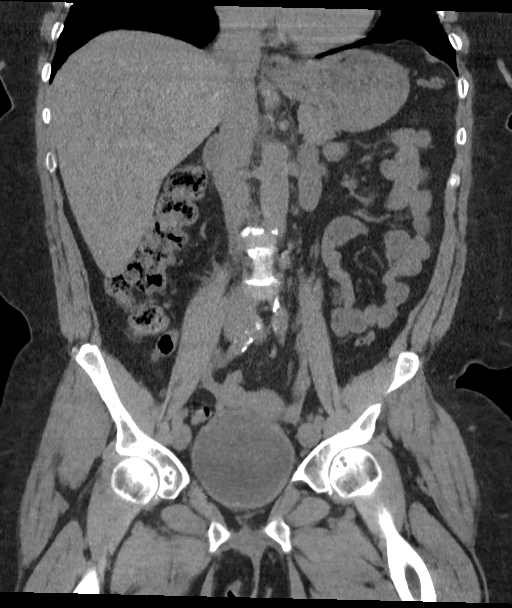
[im 83/149  soft-tissue]
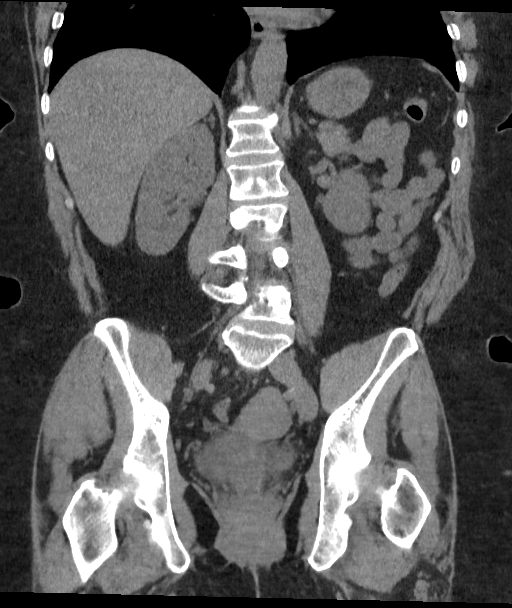

[16 of 46 positions shown; findings below may reference images not displayed]

FINDINGS: Lower chest: No acute pleural or parenchymal lung disease.

Hepatobiliary: No focal liver abnormality is seen. Status post
cholecystectomy. No biliary dilatation.

Pancreas: Unremarkable. No pancreatic ductal dilatation or
surrounding inflammatory changes.

Spleen: Normal in size without focal abnormality.

Adrenals/Urinary Tract: No urinary tract calculi or obstructive
uropathy. The adrenals are unremarkable. Bladder is moderately
distended with no focal abnormalities.

Stomach/Bowel: No bowel obstruction or ileus. Scattered
diverticulosis of the descending colon without diverticulitis. No
bowel wall thickening or inflammatory change.

Vascular/Lymphatic: Aortic atherosclerosis. No enlarged abdominal or
pelvic lymph nodes.

Reproductive: Uterus and bilateral adnexa are unremarkable.

Other: No abdominal wall hernia or abnormality. No abdominopelvic
ascites.

Musculoskeletal: No acute or destructive bony lesions. Reconstructed
images demonstrate no additional findings.
IMPRESSION: 1. No urinary tract calculi or obstructive uropathy.
2. Scattered diverticulosis of the descending colon without
diverticulitis.
3. Aortic Atherosclerosis ([id number]).

## 2021-09-22 LAB — COLOGUARD: COLOGUARD: NEGATIVE

## 2022-01-07 ENCOUNTER — Other Ambulatory Visit: Payer: Self-pay

## 2022-01-07 DIAGNOSIS — U071 COVID-19: Secondary | ICD-10-CM | POA: Diagnosis not present

## 2022-01-07 DIAGNOSIS — R040 Epistaxis: Secondary | ICD-10-CM | POA: Insufficient documentation

## 2022-01-07 DIAGNOSIS — R0981 Nasal congestion: Secondary | ICD-10-CM | POA: Diagnosis present

## 2022-01-07 MED ORDER — OXYMETAZOLINE HCL 0.05 % NA SOLN
1.0000 | Freq: Once | NASAL | Status: AC
  Administered 2022-01-07: 1 via NASAL
  Filled 2022-01-07: qty 30

## 2022-01-07 NOTE — ED Triage Notes (Signed)
Pt presents to ER c/o nosebleed that started today around 1700 and pt was able to get it to stop.  Pt states nosebleed started again tonight around 2230 and pt has not been able to stop this nosebleed which is why she came in. Pt states she has been sick with cold-like symptoms for last few days.   Pt denies blowing nose a lot with her cold.  Pt denies taking blood thinners.  Pt A&O x4 at this time in NAD.

## 2022-01-08 ENCOUNTER — Emergency Department
Admission: EM | Admit: 2022-01-08 | Discharge: 2022-01-08 | Disposition: A | Discharge status: Home / Self Care | Payer: BC Managed Care – PPO | Attending: Emergency Medicine | Admitting: Emergency Medicine

## 2022-01-08 DIAGNOSIS — R040 Epistaxis: Secondary | ICD-10-CM

## 2022-01-08 LAB — RESP PANEL BY RT-PCR (FLU A&B, COVID) ARPGX2
Influenza A by PCR: NEGATIVE
Influenza B by PCR: NEGATIVE
SARS Coronavirus 2 by RT PCR: POSITIVE — AB

## 2022-01-08 NOTE — ED Notes (Signed)
See triage note, pt reports intermittent nose bleed yesterday. No bleeding noted. No blood thinner use. EDP at bedside.

## 2022-01-08 NOTE — Progress Notes (Signed)
°   01/08/22 0525  Clinical Encounter Type  Visited With Patient  Visit Type Initial;Spiritual support;Social support   Chaplain Burris assisted Pt with her inquiry about wait times. Spoke with her and offered support and encouragement.

## 2022-01-08 NOTE — Discharge Instructions (Addendum)
Please take Tylenol and ibuprofen/Advil for your pain.  It is safe to take them together, or to alternate them every few hours.  Take up to 1000mg of Tylenol at a time, up to 4 times per day.  Do not take more than 4000 mg of Tylenol in 24 hours.  For ibuprofen, take 400-600 mg, 4-5 times per day. ° ° °

## 2022-01-08 NOTE — ED Provider Notes (Signed)
[facility] Provider Note    Event Date/Time   First MD Initiated Contact with Patient 01/08/22 0701     (approximate)   History   Epistaxis   HPI  Crystal Aguilar is a 61 y.o. female who presents to the ED for evaluation of Epistaxis   I review outpatient PCP visit from May 2022.  Tobacco abuse, morbid obesity and no blood thinners.  Patient presents to the ED, accompanied by her husband, for evaluation of left-sided epistaxis.  Patient reports 1-2 weeks of respiratory congestion and frequent blowing of her nose.  Denies fever, cough or systemic symptoms.  Reports developing left-sided epistaxis, bleeding anteriorly, yesterday evening around 5 PM, lasting less than 1 hour before self resolving.  Recurred again around 10 PM, and due to the recurrent she presented to the ED for evaluation.  She reports it resolved in triage with application of Afrin and a nasal clamp.  She has not been waiting nearly 8 hours for my evaluation, due to remarkable staffing issues and bed holds.  Present time, no recurrence of bleeding and she has no complaints during my evaluation.   Physical Exam   Triage Vital Signs: ED Triage Vitals  Enc Vitals Group     BP 01/07/22 2336 (!) 152/98     Pulse Rate 01/07/22 2336 (!) 104     Resp 01/07/22 2336 20     Temp 01/07/22 2336 98.7 F (37.1 C)     Temp Source 01/07/22 2336 Oral     SpO2 01/07/22 2336 96 %     Weight 01/07/22 2349 225 lb (102.1 kg)     Height 01/07/22 2349 5\' 2"  (1.575 m)     Head Circumference --      Peak Flow --      Pain Score 01/07/22 2349 8     Pain Loc --      Pain Edu? --      Excl. in GC? --     Most recent vital signs: Vitals:   01/07/22 2336 01/08/22 0719  BP: (!) 152/98 128/80  Pulse: (!) 104 85  Resp: 20 20  Temp: 98.7 F (37.1 C)   SpO2: 96% 98%    General: Awake, no distress.  CV:  Good peripheral perfusion.  Resp:  Normal effort.  Abd:  No distention.  MSK:  No deformity  noted.  Neuro:  No focal deficits appreciated. Other:  Nonbleeding bilateral nares, no evidence of blood posteriorly on oropharyngeal examination.  No obvious anterior lesions on the left to require cauterization   ED Results / Procedures / Treatments   Labs (all labs ordered are listed, but only abnormal results are displayed) Labs Reviewed  RESP PANEL BY RT-PCR (FLU A&B, COVID) [id number]    EKG    RADIOLOGY   Official radiology report(s): No results found.  PROCEDURES and INTERVENTIONS:  Procedures  Medications  oxymetazoline (AFRIN) 0.05 % nasal spray 1 spray (1 spray Each Nare Given 01/07/22 2353)     IMPRESSION / MDM / ASSESSMENT AND PLAN / ED COURSE  I reviewed the triage vital signs and the nursing notes.  61 year old female presents to the ED with resolved left-sided anterior epistaxis suitable for outpatient management.  She is tachycardic in triage while bleeding and uncomfortable, and this vital sign derangement resolves after hemostasis.  She was observed for nearly 8 hours without recurrence of her bleeding.  No indications for diagnostic blood work, as I have a very low suspicion  for significant blood loss anemia.  Provided the remainder of her Afrin bottle and a clean nose clamp to go.  We discussed epistaxis management at home and return precautions for the ED.  We will swab her for COVID-19 prior to discharge considering her subacute symptoms and concern for flu or COVID.      FINAL CLINICAL IMPRESSION(S) / ED DIAGNOSES   Final diagnoses:  Anterior epistaxis  Left-sided epistaxis     Rx / DC Orders   ED Discharge Orders     None        Note:  This document was prepared using Dragon voice recognition software and may include unintentional dictation errors.   Delton [place], MD 01/08/22 [phone]

## 2022-01-11 ENCOUNTER — Ambulatory Visit
Admission: EM | Admit: 2022-01-11 | Discharge: 2022-01-11 | Disposition: A | Discharge status: Other Acute Inpatient Hospital | Payer: BC Managed Care – PPO

## 2022-01-11 ENCOUNTER — Other Ambulatory Visit: Payer: Self-pay

## 2022-01-11 DIAGNOSIS — I10 Essential (primary) hypertension: Secondary | ICD-10-CM

## 2022-01-11 DIAGNOSIS — R519 Headache, unspecified: Secondary | ICD-10-CM | POA: Diagnosis not present

## 2022-01-11 DIAGNOSIS — R29810 Facial weakness: Secondary | ICD-10-CM

## 2022-01-11 NOTE — ED Notes (Signed)
Patient is being discharged from the Urgent Care and sent to the Emergency Department via POV . Per Margarette Canada NP, patient is in need of higher level of care due to possible stroke. Patient is aware and verbalizes understanding of plan of care.  Vitals:   01/11/22 1309  BP: (!) 147/93  Pulse: 68  Resp: 18  Temp: 98.8 F (37.1 C)  SpO2: 98%

## 2022-01-11 NOTE — ED Triage Notes (Signed)
Pt here with C/O head spinning, for over 1 week, was at ER on 01/07/2022, states she didn't feel like they addressed it.

## 2022-01-11 NOTE — Discharge Instructions (Addendum)
As we discussed, your physical exam has some abnormal findings that are concerning for you having possibly had a stroke.  Therefore, I recommend that she go to the emergency department at [facility] for further evaluation, specialty consultation, and dynamic imaging of your brain.

## 2022-01-11 NOTE — ED Provider Notes (Signed)
[facility]    CSN: [id number] Arrival date & time: 01/11/22  1120      History   Chief Complaint Chief Complaint  Patient presents with   Dizziness    HPI Crystal Aguilar is a 61 y.o. female.   HPI  61 year old female here for evaluation of neurologic complaints.  Patient reports that for over a week she has been experiencing what she calls "head spinning".  She states that this is not room spinning but is within her head and upon clarification it sounds like she is experiencing headaches.  She is also been having intermittent hot and cold chills and decreased appetite.  She has taken a single 500 mg Tylenol tablet at 2 different occasions in the past week for her headache without any improvement.  She denies any changes in vision, weakness, chest pain, shortness of breath.  She states that she is having pain in her left arm and it is hard to raise over her head.  She reports that she was evaluated in the emergency department at [facility] on 01/07/2022 but she does not feel like her symptoms were properly addressed.  History reviewed. No pertinent past medical history.  There are no problems to display for this patient.   History reviewed. No pertinent surgical history.  OB History   No obstetric history on file.      Home Medications    Prior to Admission medications   Not on File    Family History History reviewed. No pertinent family history.  Social History Social History   Tobacco Use   Smoking status: Former    Types: Cigarettes   Smokeless tobacco: Never  Vaping Use   Vaping Use: Never used  Substance Use Topics   Alcohol use: Yes    Alcohol/week: 0.0 standard drinks   Drug use: No     Allergies   Penicillins   Review of Systems Review of Systems  Constitutional:  Positive for chills. Negative for activity change and appetite change.  Respiratory:  Negative for shortness of breath.   Cardiovascular:  Negative for chest pain.   Musculoskeletal:  Positive for myalgias.  Neurological:  Positive for headaches. Negative for dizziness, syncope, speech difficulty and weakness.  Hematological: Negative.   Psychiatric/Behavioral: Negative.      Physical Exam Triage Vital Signs ED Triage Vitals  Enc Vitals Group     BP 01/11/22 1309 (!) 147/93     Pulse Rate 01/11/22 1309 68     Resp 01/11/22 1309 18     Temp 01/11/22 1309 98.8 F (37.1 C)     Temp Source 01/11/22 1309 Oral     SpO2 01/11/22 1309 98 %     Weight 01/11/22 1308 225 lb (102.1 kg)     Height 01/11/22 1308 5\' 2"  (1.575 m)     Head Circumference --      Peak Flow --      Pain Score 01/11/22 1308 0     Pain Loc --      Pain Edu? --      Excl. in GC? --    No data found.  Updated Vital Signs BP (!) 147/93 (BP Location: Left Arm)    Pulse 68    Temp 98.8 F (37.1 C) (Oral)    Resp 18    Ht 5\' 2"  (1.575 m)    Wt 225 lb (102.1 kg)    SpO2 98%    BMI 41.15 kg/m   Visual Acuity  Right Eye Distance:   Left Eye Distance:   Bilateral Distance:    Right Eye Near:   Left Eye Near:    Bilateral Near:     Physical Exam Vitals and nursing note reviewed.  Constitutional:      General: She is in acute distress.     Appearance: She is obese.  HENT:     Head: Normocephalic and atraumatic.     Right Ear: Tympanic membrane, ear canal and external ear normal. There is no impacted cerumen.     Left Ear: Tympanic membrane and ear canal normal. There is no impacted cerumen.     Nose: Nose normal.     Mouth/Throat:     Mouth: Mucous membranes are moist.     Pharynx: Oropharynx is clear. No posterior oropharyngeal erythema.     Comments: Mucous membranes are sticky. Eyes:     Extraocular Movements: Extraocular movements intact.     Pupils: Pupils are equal, round, and reactive to light.     Comments: Sclera are tacky in appearance.    Skin:    General: Skin is warm and dry.     Capillary Refill: Capillary refill takes less than 2 seconds.   Neurological:     Mental Status: She is alert.     Sensory: No sensory deficit.     Motor: Weakness present.     Coordination: Coordination abnormal.     UC Treatments / Results  Labs (all labs ordered are listed, but only abnormal results are displayed) Labs Reviewed - No data to display  EKG   Radiology No results found.  Procedures Procedures (including critical care time)  Medications Ordered in UC Medications - No data to display  Initial Impression / Assessment and Plan / UC Course  I have reviewed the triage vital signs and the nursing notes.  Pertinent labs & imaging results that were available during my care of the patient were reviewed by me and considered in my medical decision making (see chart for details).  Patient is a 61 year old female who appears to be in a moderate degree of distress secondary to headache pain presenting for evaluation of greater than 1 week worth of headache.  Patient describes her headache as "head spinning".  Patient reports that she was evaluated in the emergency department at [facility] on 01/07/2022 for a nosebleed and she was experiencing her headache at that time.  She states that she does not feel like the ER addressed her headache at that time.  Upon reading the ER note there is no mention of headache or dizziness.  The patient presented with left-sided epistaxis and was treated with Afrin and nose clamp in triage.  Her BP in triage was 152/98 and she was tachycardiac at 104.  Upon reviewing the historical trend of blood pressures in epic patient has a history of elevated blood pressure on her last 3 visits to the urgent care.  Patient does not actively carry a diagnosis of hypertension and states that her BP has always been borderline.  Review of her last clinic note at [facility] [address] failure medicine showed a BP of 132/80.  On physical exam patient's pupils are equal round and reactive but she does have difficulty following my finger to assess extraocular  motion.  She was found able to do so on her fifth attempt with great difficulty.  I asked the patient to remove her mask so that I could assess the remainder of her cranial nerves and noticed that she  has a slight facial droop on the left.  As I have never seen this patient before I asked her significant other to look at her face and he to agreed that it was drooping on the left-hand side.  Patient is able to raise her eyebrows and puff out her cheeks though there is a deficit on the left-hand side as well.  The patient is able to stick out her tongue but she cannot move it from side to side and it tends to fasciculate.  Patient's left grip is a 4/5 in her right grip is a 5/5.  Left upper extremity strength is also 4/5 in her right upper extremity strength is 5/5.  When attempting to check pronator drift patient has a slight pronator drift on the left and she is unable to maintain her palm upwards to the air as it rotates inward as her arm dropped slightly.  Patient's bilateral lower extremity strength is 5/5.  My concern is that patient has had a subacute neurologic event and I feel she would best be served with a further evaluation in the emergency department to include dynamic imaging of her head with CT and/or MRI, neither of which are available to me here in the urgent care.  Patient is ambulatory and she left in stable condition [facility].   Final Clinical Impressions(s) / UC Diagnoses   Final diagnoses:  Facial droop  Acute intractable headache, unspecified headache type  Hypertension, unspecified type     Discharge Instructions      As we discussed, your physical exam has some abnormal findings that are concerning for you having possibly had a stroke.  Therefore, I recommend that she go to the emergency [facility] for further evaluation, specialty consultation, and dynamic imaging of your brain.     ED Prescriptions   None    PDMP not  reviewed this encounter.   Becky Augustayan, Lacorey Brusca, NP 01/11/22 1512

## 2024-11-07 LAB — COLOGUARD: COLOGUARD: NEGATIVE

## 2024-12-24 ENCOUNTER — Encounter: Payer: Self-pay | Admitting: Emergency Medicine

## 2024-12-24 ENCOUNTER — Other Ambulatory Visit: Payer: Self-pay

## 2024-12-24 ENCOUNTER — Emergency Department
Admission: EM | Admit: 2024-12-24 | Discharge: 2024-12-24 | Disposition: A | Discharge status: Home / Self Care | Attending: Emergency Medicine | Admitting: Emergency Medicine

## 2024-12-24 DIAGNOSIS — R1012 Left upper quadrant pain: Secondary | ICD-10-CM | POA: Diagnosis present

## 2024-12-24 DIAGNOSIS — E119 Type 2 diabetes mellitus without complications: Secondary | ICD-10-CM | POA: Insufficient documentation

## 2024-12-24 DIAGNOSIS — I10 Essential (primary) hypertension: Secondary | ICD-10-CM | POA: Diagnosis not present

## 2024-12-24 DIAGNOSIS — K2900 Acute gastritis without bleeding: Secondary | ICD-10-CM | POA: Diagnosis not present

## 2024-12-24 HISTORY — DX: Type 2 diabetes mellitus without complications: E11.9

## 2024-12-24 HISTORY — DX: Essential (primary) hypertension: I10

## 2024-12-24 HISTORY — DX: Pure hypercholesterolemia, unspecified: E78.00

## 2024-12-24 LAB — URINALYSIS, ROUTINE W REFLEX MICROSCOPIC
Bilirubin Urine: NEGATIVE
Glucose, UA: NEGATIVE mg/dL
Hgb urine dipstick: NEGATIVE
Ketones, ur: NEGATIVE mg/dL
Nitrite: NEGATIVE
Protein, ur: NEGATIVE mg/dL
Specific Gravity, Urine: 1.011 (ref 1.005–1.030)
pH: 6 (ref 5.0–8.0)

## 2024-12-24 LAB — CBC
HCT: 44.9 % (ref 36.0–46.0)
Hemoglobin: 13.8 g/dL (ref 12.0–15.0)
MCH: 26.1 pg (ref 26.0–34.0)
MCHC: 30.7 g/dL (ref 30.0–36.0)
MCV: 85 fL (ref 80.0–100.0)
Platelets: 385 K/uL (ref 150–400)
RBC: 5.28 MIL/uL — ABNORMAL HIGH (ref 3.87–5.11)
RDW: 13.2 % (ref 11.5–15.5)
WBC: 5.9 K/uL (ref 4.0–10.5)
nRBC: 0 % (ref 0.0–0.2)

## 2024-12-24 LAB — COMPREHENSIVE METABOLIC PANEL WITH GFR
ALT: 16 U/L (ref 0–44)
AST: 19 U/L (ref 15–41)
Albumin: 4.3 g/dL (ref 3.5–5.0)
Alkaline Phosphatase: 55 U/L (ref 38–126)
Anion gap: 11 (ref 5–15)
BUN: 13 mg/dL (ref 8–23)
CO2: 28 mmol/L (ref 22–32)
Calcium: 9.9 mg/dL (ref 8.9–10.3)
Chloride: 106 mmol/L (ref 98–111)
Creatinine, Ser: 0.81 mg/dL (ref 0.44–1.00)
GFR, Estimated: >60 mL/min
Glucose, Bld: 86 mg/dL (ref 70–99)
Potassium: 4.6 mmol/L (ref 3.5–5.1)
Sodium: 145 mmol/L (ref 135–145)
Total Bilirubin: 0.5 mg/dL (ref 0.0–1.2)
Total Protein: 7.7 g/dL (ref 6.5–8.1)

## 2024-12-24 LAB — LIPASE, BLOOD: Lipase: 106 U/L — ABNORMAL HIGH (ref 11–51)

## 2024-12-24 MED ORDER — PANTOPRAZOLE SODIUM 40 MG PO TBEC: 40.0000 mg | DELAYED_RELEASE_TABLET | Freq: Every day | ORAL | 1 refills | Status: AC

## 2024-12-24 MED ORDER — LIDOCAINE VISCOUS HCL 2 % MT SOLN
15.0000 mL | Freq: Once | OROMUCOSAL | Status: AC
  Administered 2024-12-24: 15 mL via ORAL
  Filled 2024-12-24: qty 15

## 2024-12-24 MED ORDER — ALUM & MAG HYDROXIDE-SIMETH 200-200-20 MG/5ML PO SUSP
30.0000 mL | Freq: Once | ORAL | Status: AC
  Administered 2024-12-24: 30 mL via ORAL
  Filled 2024-12-24: qty 30

## 2024-12-24 NOTE — ED Triage Notes (Addendum)
 C/O LLQ abd pain, nausea, fatigue.  Had flu last week, tamiflu taken. Also c/o headache.  AAOx3. Skin warm and dry. NAD

## 2024-12-24 NOTE — ED Provider Notes (Signed)
 "  [facility] Provider Note    Event Date/Time   First MD Initiated Contact with Patient 12/24/24 1141     (approximate)   History   Abdominal Pain   HPI  Crystal Aguilar is a 64 y.o. female with a history of diabetes, hypertension with a history of cholecystectomy who presents with complaints of left upper quadrant abdominal discomfort and mild nausea which has been ongoing intermittently over the last several days.  She recently recovered from flulike illness     Physical Exam   Triage Vital Signs: ED Triage Vitals [12/24/24 1129]  Encounter Vitals Group     BP (!) 145/78     Girls Systolic BP Percentile      Girls Diastolic BP Percentile      Boys Systolic BP Percentile      Boys Diastolic BP Percentile      Pulse Rate 77     Resp 16     Temp 98.2 F (36.8 C)     Temp Source Oral     SpO2 100 %     Weight      Height      Head Circumference      Peak Flow      Pain Score      Pain Loc      Pain Education      Exclude from Growth Chart     Most recent vital signs: Vitals:   12/24/24 1129  BP: (!) 145/78  Pulse: 77  Resp: 16  Temp: 98.2 F (36.8 C)  SpO2: 100%     General: Awake, no distress.  CV:  Good peripheral perfusion.  Resp:  Normal effort.  Abd:  No distention.  Soft, nontender, reassuring exam Other:     ED Results / Procedures / Treatments   Labs (all labs ordered are listed, but only abnormal results are displayed) Labs Reviewed  LIPASE, BLOOD - Abnormal; Notable for the following components:      Result Value   Lipase 106 (*)    All other components within normal limits  CBC - Abnormal; Notable for the following components:   RBC 5.28 (*)    All other components within normal limits  URINALYSIS, ROUTINE W REFLEX MICROSCOPIC - Abnormal; Notable for the following components:   Color, Urine YELLOW (*)    APPearance CLEAR (*)    Leukocytes,Ua TRACE (*)    Bacteria, UA RARE (*)    All other  components within normal limits  COMPREHENSIVE METABOLIC PANEL WITH GFR     EKG ED ECG REPORT I, Lamar Price, the attending physician, personally viewed and interpreted this ECG.  Date: 12/24/2024  Rhythm: normal sinus rhythm QRS Axis: normal Intervals: normal ST/T Wave abnormalities: normal Narrative Interpretation: no evidence of acute ischemia     RADIOLOGY     PROCEDURES:  Critical Care performed:   Procedures   MEDICATIONS ORDERED IN ED: Medications  alum & mag hydroxide-simeth (MAALOX/MYLANTA) 200-200-20 MG/5ML suspension 30 mL (30 mLs Oral Given 12/24/24 1236)    And  lidocaine  (XYLOCAINE ) 2 % viscous mouth solution 15 mL (15 mLs Oral Given 12/24/24 1236)     IMPRESSION / MDM / ASSESSMENT AND PLAN / ED COURSE  I reviewed the triage vital signs and the nursing notes. Patient's presentation is most consistent with acute presentation with potential threat to life or bodily function.  Patient presents with mild intermittent upper abdominal pain which she describes as more like nausea.  Her abdominal exam is overall reassuring.  Suspicious for gastritis, less likely pancreatitis, colitis  No significant tenderness over the epigastrium.  Lipase is mildly elevated but given reassuring exam doubt pancreatitis.  She had near total resolution after GI cocktail which is suspicious for gastritis.  Will treat with PPI, have her follow-up closely with PCP, return precautions discussed, she agrees with this plan        FINAL CLINICAL IMPRESSION(S) / ED DIAGNOSES   Final diagnoses:  Acute gastritis without hemorrhage, unspecified gastritis type     Rx / DC Orders   ED Discharge Orders          Ordered    pantoprazole  (PROTONIX ) 40 MG tablet  Daily        12/24/24 1314             Note:  This document was prepared using Dragon voice recognition software and may include unintentional dictation errors.   Arlander Charleston, MD 12/24/24 1452  "

## 2024-12-24 NOTE — ED Triage Notes (Signed)
 First nurse note: pt to ED from [facility], tested positive flu a week ago. Reports continued fatigue, nausea, abd pain.
